# Patient Record
Sex: Female | Born: 2000 | Race: White | Hispanic: No | Marital: Single | Smoking: Never smoker
Health system: Southern US, Community
[De-identification: ages and names within clinical notes are randomized; demographics above are authoritative.]

## PROBLEM LIST (undated history)

## (undated) HISTORY — PX: APPENDECTOMY: SHX54

---

## 2020-12-31 ENCOUNTER — Emergency Department (HOSPITAL_COMMUNITY): Payer: BC Managed Care – PPO

## 2020-12-31 ENCOUNTER — Encounter (HOSPITAL_COMMUNITY): Payer: Self-pay

## 2020-12-31 ENCOUNTER — Emergency Department (HOSPITAL_COMMUNITY)
Admission: EM | Admit: 2020-12-31 | Discharge: 2020-12-31 | Disposition: A | Discharge status: Home / Self Care | Payer: BC Managed Care – PPO | Attending: Emergency Medicine | Admitting: Emergency Medicine

## 2020-12-31 ENCOUNTER — Other Ambulatory Visit: Payer: Self-pay

## 2020-12-31 DIAGNOSIS — N390 Urinary tract infection, site not specified: Secondary | ICD-10-CM | POA: Insufficient documentation

## 2020-12-31 DIAGNOSIS — R000 Tachycardia, unspecified: Secondary | ICD-10-CM | POA: Insufficient documentation

## 2020-12-31 DIAGNOSIS — R509 Fever, unspecified: Secondary | ICD-10-CM | POA: Diagnosis present

## 2020-12-31 DIAGNOSIS — J039 Acute tonsillitis, unspecified: Secondary | ICD-10-CM | POA: Insufficient documentation

## 2020-12-31 LAB — COMPREHENSIVE METABOLIC PANEL
ALT: 42 U/L (ref 0–44)
AST: 39 U/L (ref 15–41)
Albumin: 4.1 g/dL (ref 3.5–5.0)
Alkaline Phosphatase: 68 U/L (ref 38–126)
Anion gap: 9 (ref 5–15)
BUN: 14 mg/dL (ref 6–20)
CO2: 26 mmol/L (ref 22–32)
Calcium: 9.7 mg/dL (ref 8.9–10.3)
Chloride: 101 mmol/L (ref 98–111)
Creatinine, Ser: 0.88 mg/dL (ref 0.44–1.00)
GFR, Estimated: >60 mL/min (ref >60)
Glucose, Bld: 119 mg/dL — ABNORMAL HIGH (ref 70–99)
Potassium: 3.9 mmol/L (ref 3.5–5.1)
Sodium: 136 mmol/L (ref 135–145)
Total Bilirubin: 0.4 mg/dL (ref 0.3–1.2)
Total Protein: 7.4 g/dL (ref 6.5–8.1)

## 2020-12-31 LAB — CBC WITH DIFFERENTIAL/PLATELET
Abs Immature Granulocytes: 0.13 10*3/uL — ABNORMAL HIGH (ref 0.00–0.07)
Basophils Absolute: 0.1 10*3/uL (ref 0.0–0.1)
Basophils Relative: 0 %
Eosinophils Absolute: 0 10*3/uL (ref 0.0–0.5)
Eosinophils Relative: 0 %
HCT: 39.9 % (ref 36.0–46.0)
Hemoglobin: 13.7 g/dL (ref 12.0–15.0)
Immature Granulocytes: 1 %
Lymphocytes Relative: 9 %
Lymphs Abs: 2.2 10*3/uL (ref 0.7–4.0)
MCH: 32.2 pg (ref 26.0–34.0)
MCHC: 34.3 g/dL (ref 30.0–36.0)
MCV: 93.9 fL (ref 80.0–100.0)
Monocytes Absolute: 2.1 10*3/uL — ABNORMAL HIGH (ref 0.1–1.0)
Monocytes Relative: 9 %
Neutro Abs: 19.1 10*3/uL — ABNORMAL HIGH (ref 1.7–7.7)
Neutrophils Relative %: 81 %
Platelets: 335 10*3/uL (ref 150–400)
RBC: 4.25 MIL/uL (ref 3.87–5.11)
RDW: 11.7 % (ref 11.5–15.5)
WBC: 23.6 10*3/uL — ABNORMAL HIGH (ref 4.0–10.5)
nRBC: 0 % (ref 0.0–0.2)

## 2020-12-31 LAB — URINALYSIS, ROUTINE W REFLEX MICROSCOPIC
Bilirubin Urine: NEGATIVE
Glucose, UA: NEGATIVE mg/dL
Hgb urine dipstick: NEGATIVE
Ketones, ur: 5 mg/dL — AB
Leukocytes,Ua: NEGATIVE
Nitrite: NEGATIVE
Protein, ur: NEGATIVE mg/dL
Specific Gravity, Urine: 1.031 — ABNORMAL HIGH (ref 1.005–1.030)
pH: 5 (ref 5.0–8.0)

## 2020-12-31 LAB — I-STAT BETA HCG BLOOD, ED (MC, WL, AP ONLY): I-stat hCG, quantitative: <5 m[IU]/mL (ref <5)

## 2020-12-31 LAB — LACTIC ACID, PLASMA: Lactic Acid, Venous: 2 mmol/L (ref 0.5–1.9)

## 2020-12-31 MED ORDER — AMOXICILLIN-POT CLAVULANATE 875-125 MG PO TABS
1.0000 | ORAL_TABLET | Freq: Once | ORAL | Status: AC
  Administered 2020-12-31: 1 via ORAL
  Filled 2020-12-31: qty 1

## 2020-12-31 MED ORDER — AMOXICILLIN-POT CLAVULANATE 875-125 MG PO TABS: 1.0000 | ORAL_TABLET | Freq: Two times a day (BID) | ORAL | 0 refills | Status: AC

## 2020-12-31 NOTE — ED Provider Notes (Signed)
[facility] EMERGENCY DEPARTMENT Provider Note   CSN: [id number] Arrival date & time: 12/31/20  0032     History Chief Complaint  Patient presents with  . Urinary Tract Infection    Melinda Craig is a 20 y.o. female.  20 yo F with a chief complaint of a sore throat and fever.  Going on for a few days now.  Started developing muscle aches mostly in her low back and some urinary symptoms and she felt like she had some hematuria as well.  She denies vaginal discharge or vaginal bleeding.  Denies abdominal pain.  Denies nausea vomiting or diarrhea.  Denies cough or congestion.  No known sick contacts.  Was recently treated for tonsillitis and upon finishing her antibiotics have recurrence of her symptoms.  The history is provided by the patient.  Urinary Tract Infection Pain quality:  Aching Pain severity:  Mild Onset quality:  Gradual Duration:  2 days Timing:  Constant Progression:  Worsening Chronicity:  New Relieved by:  Nothing Worsened by:  Nothing Ineffective treatments:  None tried Associated symptoms: fever   Associated symptoms: no nausea and no vomiting        History reviewed. No pertinent past medical history.  There are no problems to display for this patient.   Past Surgical History:  Procedure Laterality Date  . APPENDECTOMY       OB History   No obstetric history on file.     No family history on file.  Social History   Tobacco Use  . Smoking status: Never Smoker  . Smokeless tobacco: Never Used  Substance Use Topics  . Alcohol use: Yes  . Drug use: Never    Home Medications Prior to Admission medications   Medication Sig Start Date End Date Taking? Authorizing Provider  amoxicillin-clavulanate (AUGMENTIN) 875-125 MG tablet Take 1 tablet by mouth 2 (two) times daily. One po bid x 7 days 12/31/20  Yes Melene Plan, DO    Allergies    Patient has no known allergies.  Review of Systems   Review of Systems   Constitutional: Positive for chills and fever.  HENT: Positive for ear pain and sore throat. Negative for congestion and rhinorrhea.   Eyes: Negative for redness and visual disturbance.  Respiratory: Negative for shortness of breath and wheezing.   Cardiovascular: Negative for chest pain and palpitations.  Gastrointestinal: Negative for nausea and vomiting.  Genitourinary: Negative for dysuria and urgency.  Musculoskeletal: Positive for myalgias. Negative for arthralgias.  Skin: Negative for pallor and wound.  Neurological: Negative for dizziness and headaches.    Physical Exam Updated Vital Signs BP 130/71 (BP Location: Left Arm)   Pulse (!) 144   Temp 100.1 F (37.8 C) (Oral)   Resp 18   Ht 4\' 11"  (1.499 m)   Wt 68 kg   SpO2 97%   BMI 30.30 kg/m   Physical Exam Vitals and nursing note reviewed.  Constitutional:      General: She is not in acute distress.    Appearance: She is well-developed. She is not diaphoretic.  HENT:     Head: Normocephalic and atraumatic.     Ears:     Comments: Erythema to bilateral TMs left greater than right with some mild distortion of landmarks.    Mouth/Throat:     Comments: No sublingual swelling.  Able to rotate her head without issue.  Tolerating secretions without issue.  Uvula is midline.  There is erythema surrounding bilateral tonsils without  obvious exudate. Eyes:     Pupils: Pupils are equal, round, and reactive to light.  Cardiovascular:     Rate and Rhythm: Regular rhythm. Tachycardia present.     Heart sounds: No murmur heard. No friction rub. No gallop.   Pulmonary:     Effort: Pulmonary effort is normal.     Breath sounds: No wheezing or rales.  Abdominal:     General: There is no distension.     Palpations: Abdomen is soft.     Tenderness: There is no abdominal tenderness. There is no right CVA tenderness or left CVA tenderness.     Comments: Benign abdominal exam  Musculoskeletal:        General: No tenderness.      Cervical back: Normal range of motion and neck supple.  Skin:    General: Skin is warm and dry.  Neurological:     Mental Status: She is alert and oriented to person, place, and time.  Psychiatric:        Behavior: Behavior normal.     ED Results / Procedures / Treatments   Labs (all labs ordered are listed, but only abnormal results are displayed) Labs Reviewed  COMPREHENSIVE METABOLIC PANEL - Abnormal; Notable for the following components:      Result Value   Glucose, Bld 119 (*)    All other components within normal limits  CBC WITH DIFFERENTIAL/PLATELET - Abnormal; Notable for the following components:   WBC 23.6 (*)    Neutro Abs 19.1 (*)    Monocytes Absolute 2.1 (*)    Abs Immature Granulocytes 0.13 (*)    All other components within normal limits  URINALYSIS, ROUTINE W REFLEX MICROSCOPIC - Abnormal; Notable for the following components:   APPearance HAZY (*)    Specific Gravity, Urine 1.031 (*)    Ketones, ur 5 (*)    All other components within normal limits  LACTIC ACID, PLASMA  LACTIC ACID, PLASMA  I-STAT BETA HCG BLOOD, ED ([facility], [facility], [facility] ONLY)    EKG None  Radiology DG Chest 2 View  Result Date: 12/31/2020 CLINICAL DATA:  Sore throat, difficulty breathing, swollen tonsils EXAM: CHEST - 2 VIEW COMPARISON:  None. FINDINGS: The heart size and mediastinal contours are within normal limits. Both lungs are clear. The visualized skeletal structures are unremarkable. IMPRESSION: No active cardiopulmonary disease. Electronically Signed   By: Sharlet Salina M.D.   On: 12/31/2020 01:17    Procedures Procedures   Medications Ordered in ED Medications  amoxicillin-clavulanate (AUGMENTIN) 875-125 MG per tablet 1 tablet (has no administration in time range)    ED Course  I have reviewed the triage vital signs and the nursing notes.  Pertinent labs & imaging results that were available during my care of the patient were reviewed by me and considered in my medical  decision making (see chart for details).    MDM Rules/Calculators/A&P                          20 yo F here with a chief complaints of sore throat and ear pain.  This been going on for a few days.  She had recently been on antibiotics for tonsillitis and had improvement but had worsening when she stopped taking the antibiotics.  She is also been complaining of some urinary symptoms and some back discomfort.  On my exam she is well-appearing and nontoxic.  She was very tachycardic on arrival with a heart rate  in the 140s.  On my exam this seems to have improved significantly.  Blood work with out concerning issue.  She has elevated white blood cell count which is likely due to her tonsillitis.  Will start back on antibiotics.  PCP and ENT follow-up.  1:46 AM:  I have discussed the diagnosis/risks/treatment options with the patient and believe the pt to be eligible for discharge home to follow-up with PCP, ENT. We also discussed returning to the ED immediately if new or worsening sx occur. We discussed the sx which are most concerning (e.g., sudden worsening pain, fever, inability to tolerate by mouth) that necessitate immediate return. Medications administered to the patient during their visit and any new prescriptions provided to the patient are listed below.  Medications given during this visit Medications  amoxicillin-clavulanate (AUGMENTIN) 875-125 MG per tablet 1 tablet (has no administration in time range)     The patient appears reasonably screen and/or stabilized for discharge and I doubt any other medical condition or other [facility] requiring further screening, evaluation, or treatment in the ED at this time prior to discharge.   Final Clinical Impression(s) / ED Diagnoses Final diagnoses:  Tonsillitis    Rx / DC Orders ED Discharge Orders         Ordered    amoxicillin-clavulanate (AUGMENTIN) 875-125 MG tablet  2 times daily        12/31/20 0139           Melene Plan, DO 12/31/20  [phone]

## 2020-12-31 NOTE — ED Triage Notes (Signed)
Patient reports she has hx of tonsillitis and has been trying to get in touch with ENT for sx, continues to have sore throat, acid reflux and difficulty breathing due to swollen tonsils. Also states she woke up this morning with back pain, hematuria and fever.

## 2020-12-31 NOTE — Discharge Instructions (Addendum)
Take tylenol 2 pills 4 times a day and motrin 4 pills 3 times a day.  Drink plenty of fluids.  Return for worsening shortness of breath, headache, confusion. Follow up with your family doctor.   

## 2021-11-04 IMAGING — CR DG CHEST 2V
2 series · 2 of 2 positions shown · non-contrast
Comparison: None.

CLINICAL DATA: Sore throat, difficulty breathing, swollen tonsils

EXAM:
CHEST - 2 VIEW

[chest pa]
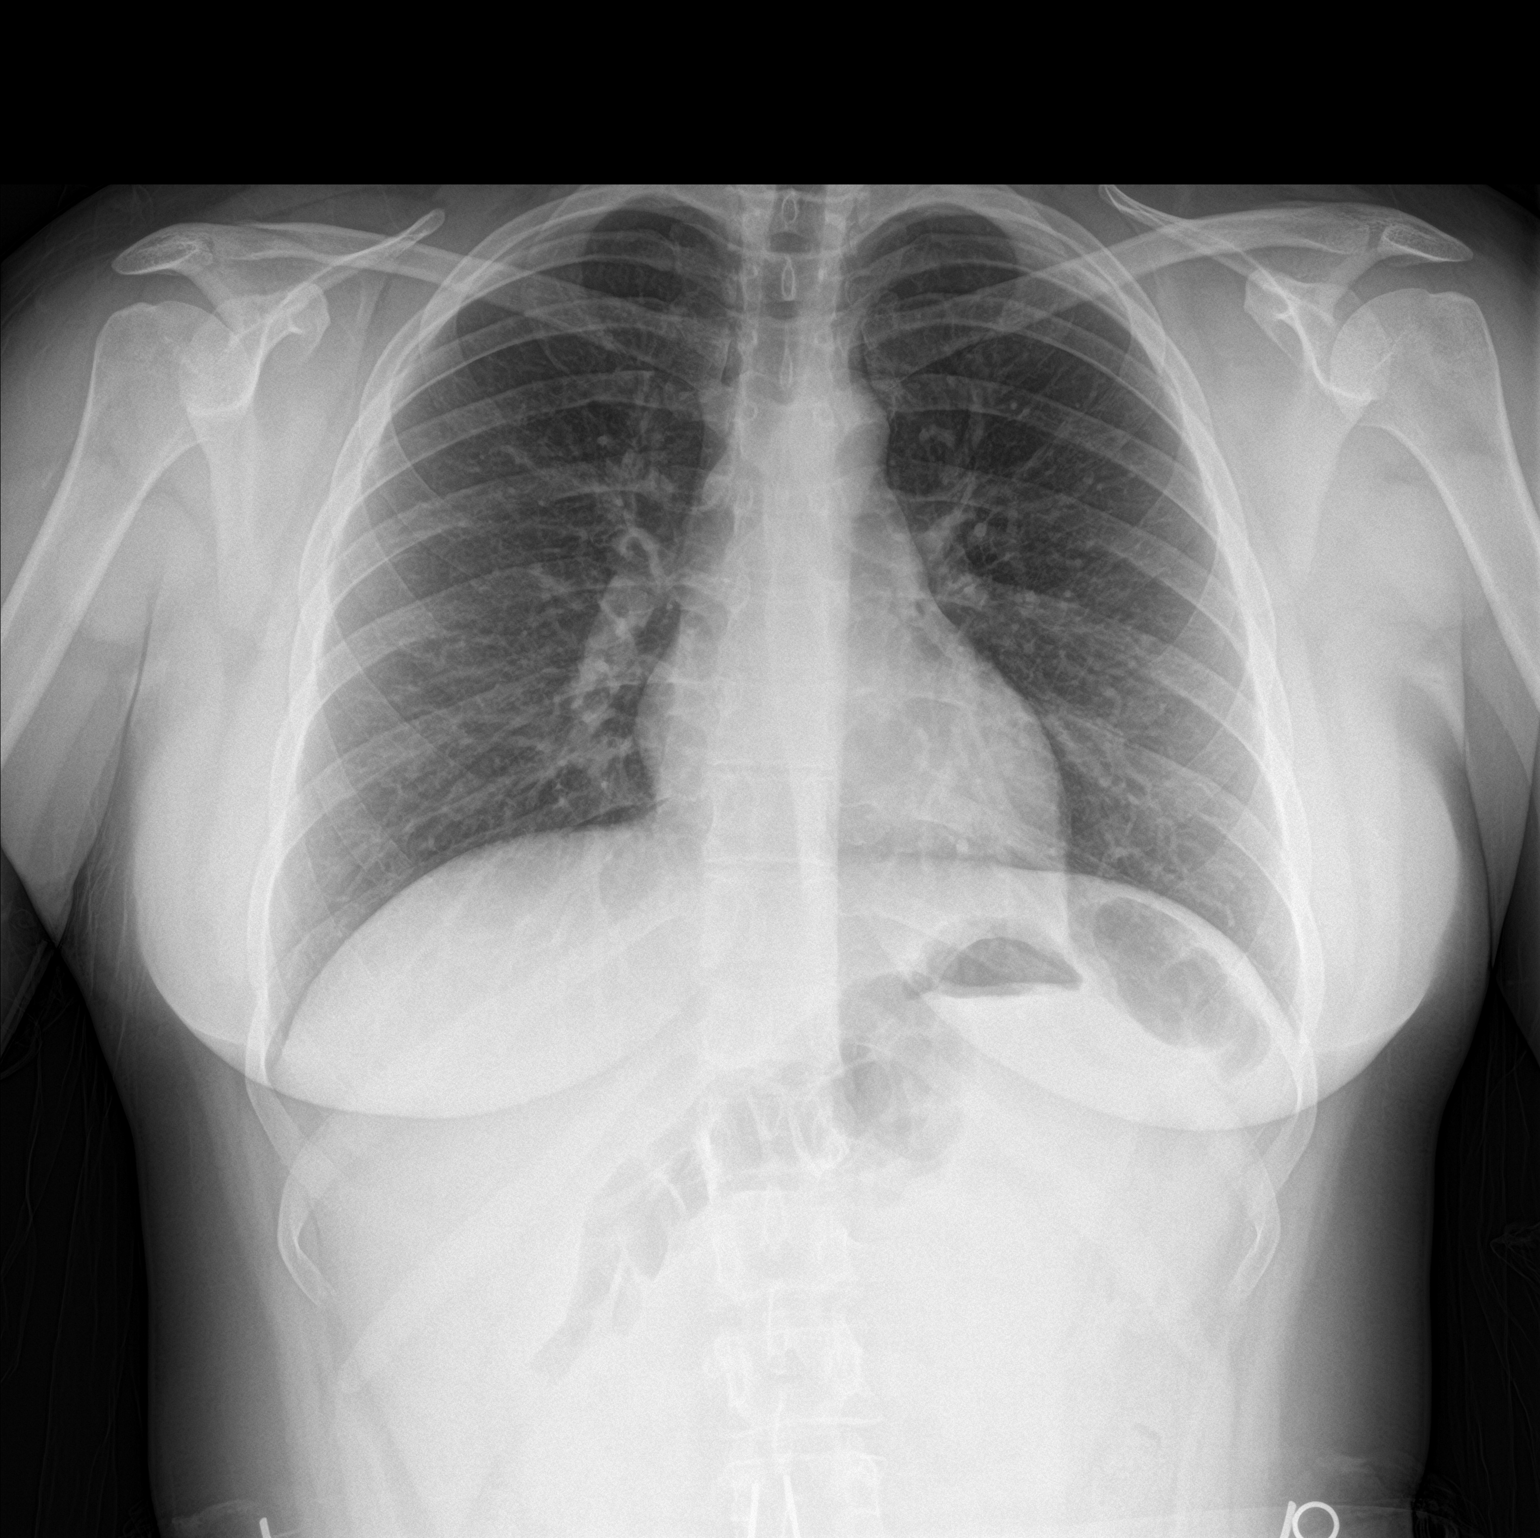

[chest lat]
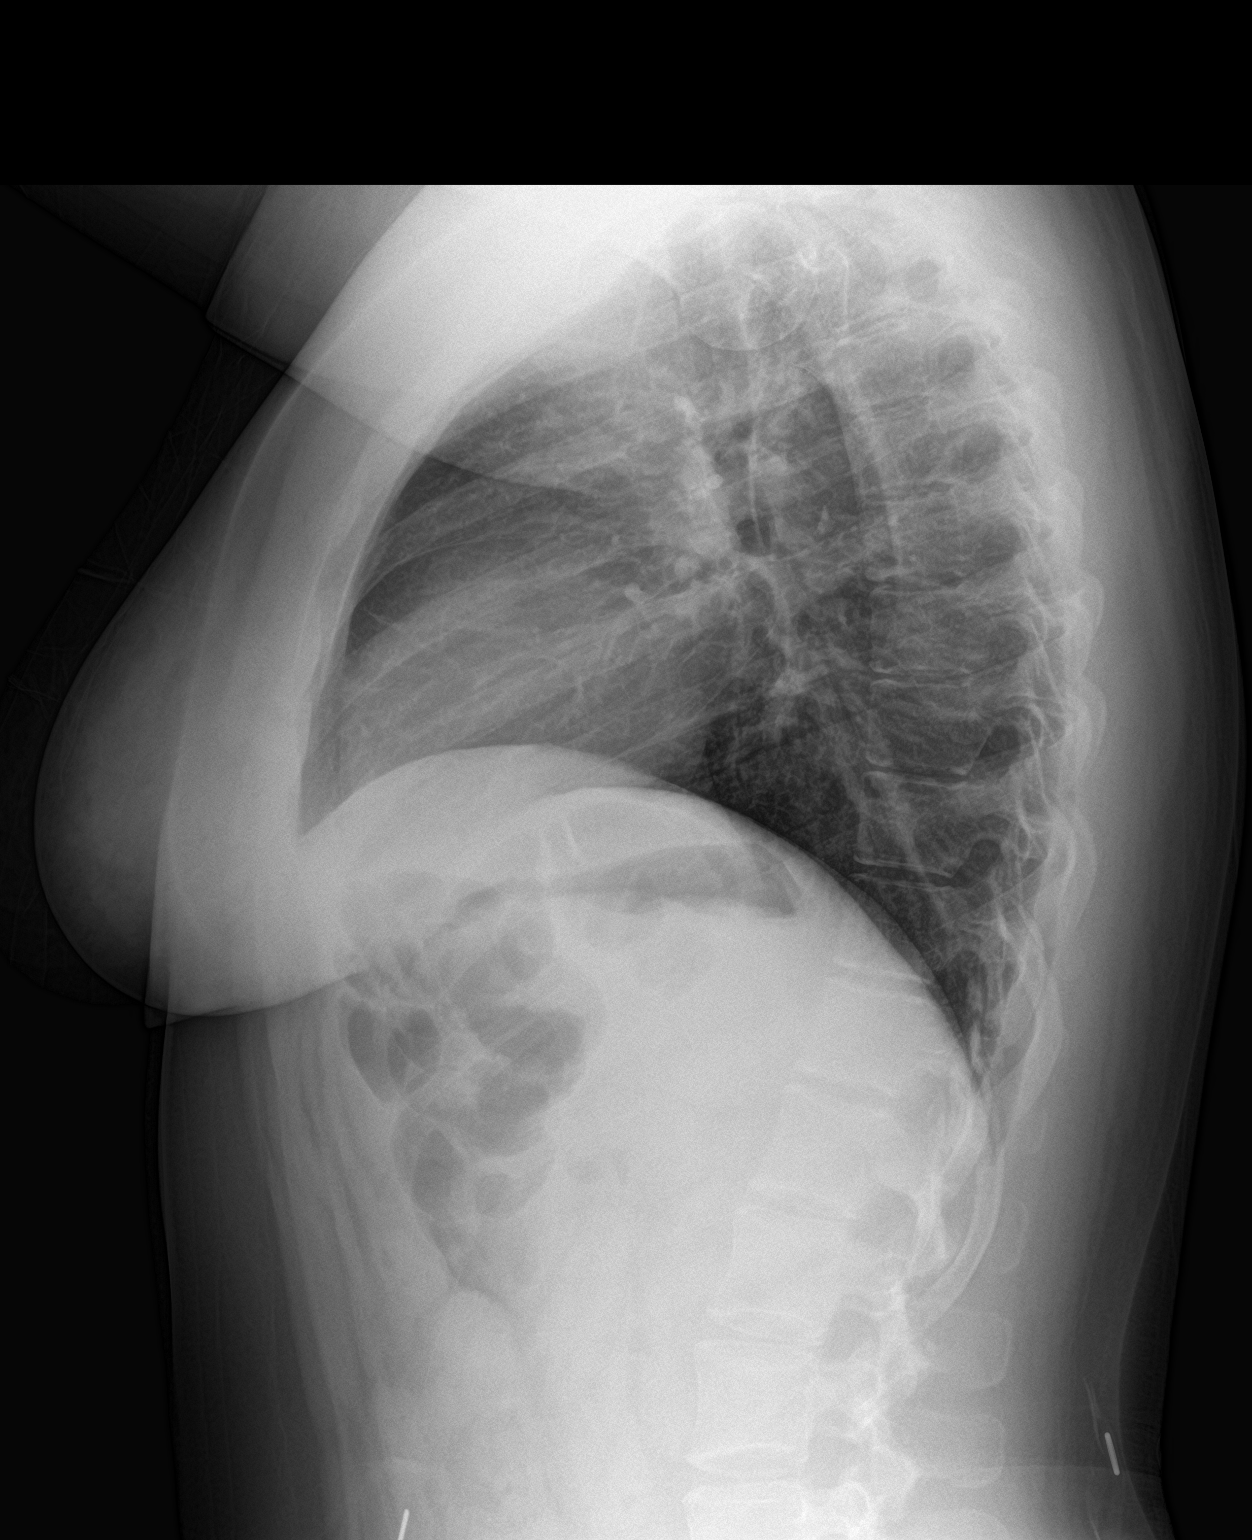

[2 of 2 positions shown; findings below may reference images not displayed]

FINDINGS: The heart size and mediastinal contours are within normal limits.
Both lungs are clear. The visualized skeletal structures are
unremarkable.
IMPRESSION: No active cardiopulmonary disease.
